# Patient Record
Sex: Male | Born: 1980 | Hispanic: Yes | Marital: Single | Smoking: Current some day smoker
Health system: Southern US, Community
[De-identification: ages and names within clinical notes are randomized; demographics above are authoritative.]

---

## 2014-08-01 ENCOUNTER — Emergency Department (HOSPITAL_COMMUNITY)
Admission: EM | Admit: 2014-08-01 | Discharge: 2014-08-01 | Disposition: A | Discharge status: Home / Self Care | Payer: Self-pay | Attending: Emergency Medicine | Admitting: Emergency Medicine

## 2014-08-01 ENCOUNTER — Emergency Department (HOSPITAL_COMMUNITY): Payer: Self-pay

## 2014-08-01 ENCOUNTER — Encounter (HOSPITAL_COMMUNITY): Payer: Self-pay | Admitting: Emergency Medicine

## 2014-08-01 DIAGNOSIS — R1013 Epigastric pain: Secondary | ICD-10-CM

## 2014-08-01 DIAGNOSIS — Z720 Tobacco use: Secondary | ICD-10-CM | POA: Insufficient documentation

## 2014-08-01 DIAGNOSIS — K2970 Gastritis, unspecified, without bleeding: Secondary | ICD-10-CM | POA: Insufficient documentation

## 2014-08-01 DIAGNOSIS — K760 Fatty (change of) liver, not elsewhere classified: Secondary | ICD-10-CM | POA: Insufficient documentation

## 2014-08-01 LAB — CBC WITH DIFFERENTIAL/PLATELET
Basophils Absolute: 0 10*3/uL (ref 0.0–0.1)
Basophils Relative: 0 % (ref 0–1)
Eosinophils Absolute: 0.1 10*3/uL (ref 0.0–0.7)
Eosinophils Relative: 1 % (ref 0–5)
HCT: 45.3 % (ref 39.0–52.0)
Hemoglobin: 16.6 g/dL (ref 13.0–17.0)
Lymphocytes Relative: 22 % (ref 12–46)
Lymphs Abs: 1.9 10*3/uL (ref 0.7–4.0)
MCH: 30.6 pg (ref 26.0–34.0)
MCHC: 36.6 g/dL — ABNORMAL HIGH (ref 30.0–36.0)
MCV: 83.4 fL (ref 78.0–100.0)
Monocytes Absolute: 0.7 10*3/uL (ref 0.1–1.0)
Monocytes Relative: 8 % (ref 3–12)
Neutro Abs: 6 10*3/uL (ref 1.7–7.7)
Neutrophils Relative %: 69 % (ref 43–77)
Platelets: 198 10*3/uL (ref 150–400)
RBC: 5.43 MIL/uL (ref 4.22–5.81)
RDW: 12.4 % (ref 11.5–15.5)
WBC: 8.7 10*3/uL (ref 4.0–10.5)

## 2014-08-01 LAB — COMPREHENSIVE METABOLIC PANEL
ALT: 62 U/L — ABNORMAL HIGH (ref 0–53)
AST: 44 U/L — ABNORMAL HIGH (ref 0–37)
Albumin: 4.7 g/dL (ref 3.5–5.2)
Alkaline Phosphatase: 94 U/L (ref 39–117)
Anion gap: 10 (ref 5–15)
BUN: 13 mg/dL (ref 6–23)
CO2: 25 mmol/L (ref 19–32)
Calcium: 9.3 mg/dL (ref 8.4–10.5)
Chloride: 100 mEq/L (ref 96–112)
Creatinine, Ser: 0.88 mg/dL (ref 0.50–1.35)
GFR calc Af Amer: >90 mL/min (ref >90)
GFR calc non Af Amer: >90 mL/min (ref >90)
Glucose, Bld: 128 mg/dL — ABNORMAL HIGH (ref 70–99)
Potassium: 3.7 mmol/L (ref 3.5–5.1)
Sodium: 135 mmol/L (ref 135–145)
Total Bilirubin: 0.9 mg/dL (ref 0.3–1.2)
Total Protein: 7.6 g/dL (ref 6.0–8.3)

## 2014-08-01 LAB — LIPASE, BLOOD: Lipase: 37 U/L (ref 11–59)

## 2014-08-01 MED ORDER — ONDANSETRON 4 MG PO TBDP: 4.0000 mg | ORAL_TABLET | Freq: Three times a day (TID) | ORAL | Status: AC | PRN

## 2014-08-01 MED ORDER — FENTANYL CITRATE 0.05 MG/ML IJ SOLN
50.0000 ug | Freq: Once | INTRAMUSCULAR | Status: AC
  Administered 2014-08-01: 50 ug via INTRAVENOUS
  Filled 2014-08-01: qty 2

## 2014-08-01 MED ORDER — ONDANSETRON HCL 4 MG/2ML IJ SOLN
4.0000 mg | Freq: Once | INTRAMUSCULAR | Status: AC
  Administered 2014-08-01: 4 mg via INTRAVENOUS
  Filled 2014-08-01: qty 2

## 2014-08-01 MED ORDER — MORPHINE SULFATE 4 MG/ML IJ SOLN
4.0000 mg | Freq: Once | INTRAMUSCULAR | Status: AC
  Administered 2014-08-01: 4 mg via INTRAVENOUS
  Filled 2014-08-01: qty 1

## 2014-08-01 MED ORDER — PANTOPRAZOLE SODIUM 40 MG PO TBEC: 40.0000 mg | DELAYED_RELEASE_TABLET | Freq: Every day | ORAL | Status: AC

## 2014-08-01 MED ORDER — ONDANSETRON 4 MG PO TBDP
8.0000 mg | ORAL_TABLET | Freq: Once | ORAL | Status: AC
  Administered 2014-08-01: 8 mg via ORAL
  Filled 2014-08-01: qty 2

## 2014-08-01 NOTE — Discharge Instructions (Signed)
Dolor abdominal °(Abdominal Pain) °El dolor puede tener muchas causas. Normalmente la causa del dolor abdominal no es una enfermedad y mejorará sin tratamiento. Frecuentemente puede controlarse y tratarse en casa. Su médico le realizará un examen físico y posiblemente solicite análisis de sangre y radiografías para ayudar a determinar la gravedad de su dolor. Sin embargo, en muchos casos, debe transcurrir más tiempo antes de que se pueda encontrar una causa evidente del dolor. Antes de llegar a ese punto, es posible que su médico no sepa si necesita más pruebas o un tratamiento más profundo. °INSTRUCCIONES PARA EL CUIDADO EN EL HOGAR  °Esté atento al dolor para ver si hay cambios. Las siguientes indicaciones ayudarán a aliviar cualquier molestia que pueda sentir: °· Tome solo medicamentos de venta libre o recetados, según las indicaciones del médico. °· No tome laxantes a menos que se lo haya indicado su médico. °· Pruebe con una dieta líquida absoluta (caldo, té o agua) según se lo indique su médico. Introduzca gradualmente una dieta normal, según su tolerancia. °SOLICITE ATENCIÓN MÉDICA SI: °· Tiene dolor abdominal sin explicación. °· Tiene dolor abdominal relacionado con náuseas o diarrea. °· Tiene dolor cuando orina o defeca. °· Experimenta dolor abdominal que lo despierta de noche. °· Tiene dolor abdominal que empeora o mejora cuando come alimentos. °· Tiene dolor abdominal que empeora cuando come alimentos grasosos. °· Tiene fiebre. °SOLICITE ATENCIÓN MÉDICA DE INMEDIATO SI:  °· El dolor no desaparece en un plazo máximo de 2 horas. °· No deja de (vomitar). °· El dolor se siente solo en partes del abdomen, como el lado derecho o la parte inferior izquierda del abdomen. °· Evacúa materia fecal sanguinolenta o negra, de aspecto alquitranado. °ASEGÚRESE DE QUE: °· Comprende estas instrucciones. °· Controlará su afección. °· Recibirá ayuda de inmediato si no mejora o si empeora. °Document Released: 07/10/2005  Document Revised: 07/15/2013 °[employer]® Patient Information ©2015 [employer]. This information is not intended to replace advice given to you by your health care provider. Make sure you discuss any questions you have with your health care provider. ° °

## 2014-08-01 NOTE — ED Notes (Signed)
Pt. Refused wheelchair and left with all belongings 

## 2014-08-01 NOTE — ED Provider Notes (Signed)
Pt seen and examined.  D/W Dr. Dalene SeltzerSchlossman.  Reports Pain since yesterday, with vomiting today/tonight.  TTP in RUQ. No mid or RLQ pain. No peritoneal irritation. Not jaundiced/icteric. Afebrile.  Minimal elevation of transaminases.  Normal WBC/AlkPhos.  US with fatty infiltration.   Plan is pain and symptom control  DC with PPI, Pain meds, anti-emetics.  Rolland PorterMark Vandora Jaskulski, MD 08/01/14 2325

## 2014-08-01 NOTE — ED Notes (Signed)
Pt from home for eval of mid upper abd pain that started yesterday, pt states he ate chicken soup today and the pain got worse. Pt also reports emesis x3 times today, denies any diarrhea or fevers at home. Denies any cp or sob at this time. Pt states more comfortable with pain following IV meds given in triage.   Interpreter line used.

## 2014-08-01 NOTE — ED Provider Notes (Signed)
CSN: [id number]     Arrival date & time 08/01/14  2023 History   First MD Initiated Contact with Patient 08/01/14 2149     Chief Complaint  Patient presents with  . Emesis  . Abdominal Pain     (Consider location/radiation/quality/duration/timing/severity/associated sxs/prior Treatment) Patient is a 34 y.o. male presenting with abdominal pain.  Abdominal Pain Pain location:  Epigastric Pain quality: sharp   Pain radiates to:  RUQ Pain severity:  Severe Duration:  1 day (started some yesterday, worsened today after eating soup) Timing:  Constant Progression:  Worsening Chronicity:  New Context: eating   Relieved by: medicine/fentanyl in triage. Worsened by:  Eating Ineffective treatments:  None tried Associated symptoms: nausea and vomiting (twice starting 2 hours ago)   Associated symptoms: no chest pain, no constipation, no diarrhea, no fever (felt warm, no measured fever), no melena, no shortness of breath and no sore throat   Risk factors: no alcohol abuse (occasional) and no NSAID use     History reviewed. No pertinent past medical history. History reviewed. No pertinent past surgical history. History reviewed. No pertinent family history. History  Substance Use Topics  . Smoking status: Current Some Day Smoker  . Smokeless tobacco: Not on file  . Alcohol Use: No    Review of Systems  Constitutional: Negative for fever (felt warm, no measured fever).  HENT: Negative for sore throat.   Eyes: Negative for visual disturbance.  Respiratory: Negative for shortness of breath.   Cardiovascular: Negative for chest pain.  Gastrointestinal: Positive for nausea, vomiting (twice starting 2 hours ago) and abdominal pain. Negative for diarrhea, constipation, blood in stool and melena.  Genitourinary: Negative for difficulty urinating.  Musculoskeletal: Negative for back pain and neck stiffness.  Skin: Negative for rash.  Neurological: Negative for syncope and headaches.       Allergies  Review of patient's allergies indicates no known allergies.  Home Medications   Prior to Admission medications   Not on File   BP 127/75 mmHg  Pulse 76  Temp(Src) 98.2 F (36.8 C) (Oral)  Resp 23  Ht 6' (1.829 m)  Wt 190 lb (86.183 kg)  BMI 25.76 kg/m2  SpO2 94% Physical Exam  Constitutional: He is oriented to person, place, and time. He appears well-developed and well-nourished. No distress.  HENT:  Head: Normocephalic and atraumatic.  Eyes: Conjunctivae and EOM are normal.  Neck: Normal range of motion.  Cardiovascular: Normal rate, regular rhythm, normal heart sounds and intact distal pulses.  Exam reveals no gallop and no friction rub.   No murmur heard. Pulmonary/Chest: Effort normal and breath sounds normal. No respiratory distress. He has no wheezes. He has no rales.  Abdominal: Soft. He exhibits no distension. There is tenderness (epigastrum, right upper quadrant). There is no guarding.  Musculoskeletal: He exhibits no edema.  Neurological: He is alert and oriented to person, place, and time.  Skin: Skin is warm and dry. He is not diaphoretic.  Nursing note and vitals reviewed.   ED Course  Procedures (including critical care time) Labs Review Labs Reviewed  COMPREHENSIVE METABOLIC PANEL - Abnormal; Notable for the following:    Glucose, Bld 128 (*)    AST 44 (*)    ALT 62 (*)    All other components within normal limits  CBC WITH DIFFERENTIAL - Abnormal; Notable for the following:    MCHC 36.6 (*)    All other components within normal limits  LIPASE, BLOOD    Imaging Review US  Abdomen Limited Ruq  08/01/2014   CLINICAL DATA:  Epigastric abdominal pain.  Nausea and vomiting.  EXAM: US ABDOMEN LIMITED - RIGHT UPPER QUADRANT  COMPARISON:  None.  FINDINGS: Gallbladder:  No gallstones or wall thickening visualized. No sonographic Murphy sign noted.  Common bile duct:  Diameter: 4.7 mm, normal  Liver:  Mild diffusely increased parenchymal  echotexture suggesting mild diffuse fatty infiltration. No focal lesions identified.  IMPRESSION: Mild fatty infiltration of the liver. Gallbladder and bile ducts are normal.   Electronically Signed   By: Burman NievesWilliam  Stevens M.D.   On: 08/01/2014 23:17     EKG Interpretation None      MDM   Final diagnoses:  Epigastric abdominal pain   34 year old Spanish male with no significant medical history presents with concern of epigastric abdominal pain, nausea and vomiting.  Differential diagnosis includes cholecystitis, pancreatitis, gastritis, hepatitis, peptic ulcer disease. Patient's CMP was significant for mild elevation in his transaminases a 44 and 62. A right upper quadrant ultrasound was obtained which showed no evidence of cholecystitis or cholelithiasis and showed mild fatty infiltration of the liver. Lipase was within normal limits.  He is without any right lower quadrant tenderness and have low suspicion for appendicitis, his exam is overall benign and have low suspicion for other perforated peptic ulcer.  Likely gastritis.  Patient given prescription for Protonix and Zofran and provided number to follow up with community health and wellness.  Discussed all discharge instructions and results with patient in detail using the interpreter service. He was discharged in stable condition with understanding of reasons to return.    Rhae LernerErin Elizabeth Joseangel Nettleton, MD 08/02/14 1358  Rolland PorterMark James, MD 08/11/14 [phone]

## 2014-08-01 NOTE — ED Notes (Signed)
Patient here with complaint of Abdominal pain and nausea. Patient actively vomiting in triage. States that the abdominal pain began yesterday and the emesis began tonight upon arrival to hospital. Explains that he at something yesterday and since then has not felt well. Describes pain as very intense upper medial abdominal pain. Obviously uncomfortable in triage. This information was obtained via translation service over phone.

## 2016-01-15 IMAGING — US US ABDOMEN LIMITED
1 series · 14 of 25 positions shown · non-contrast
Comparison: None.

CLINICAL DATA: Epigastric abdominal pain.  Nausea and vomiting.

EXAM:
US ABDOMEN LIMITED - RIGHT UPPER QUADRANT

[Series 1: us abdomen limited · 0.27mm/px · 14 of 46 slices shown]
[im 1/46]
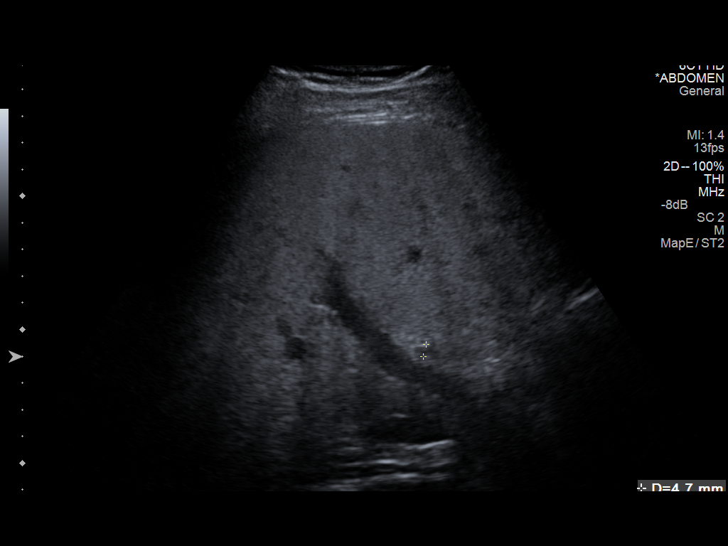
[im 4/46]
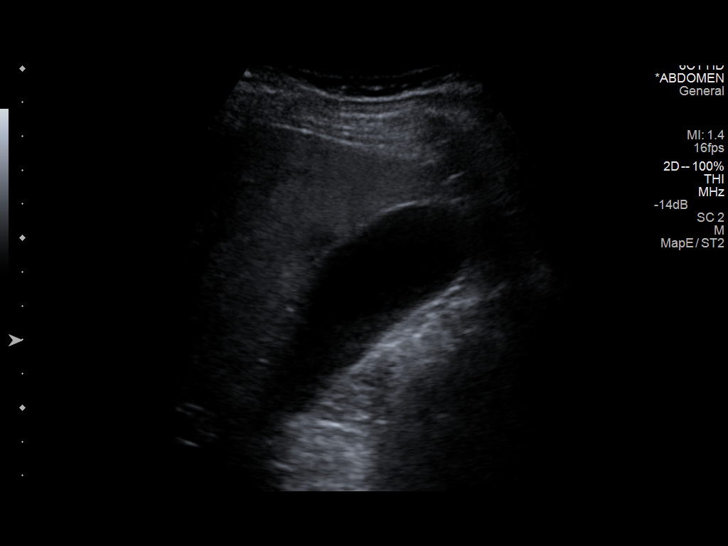
[im 8/46]
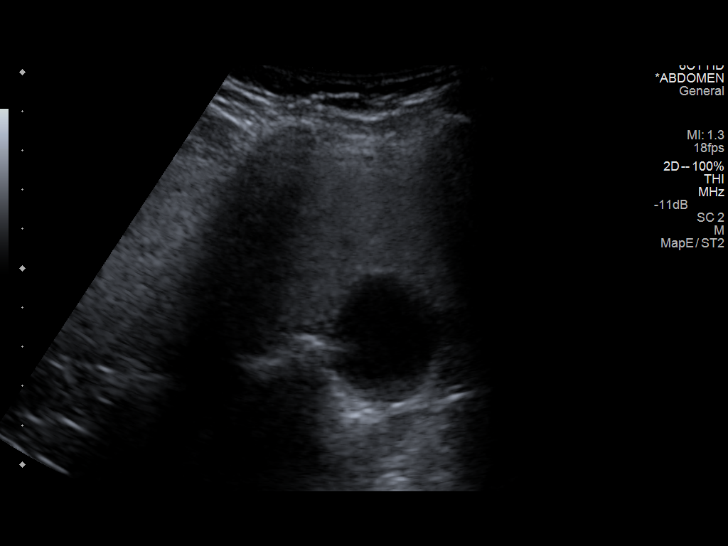
[im 12/46]
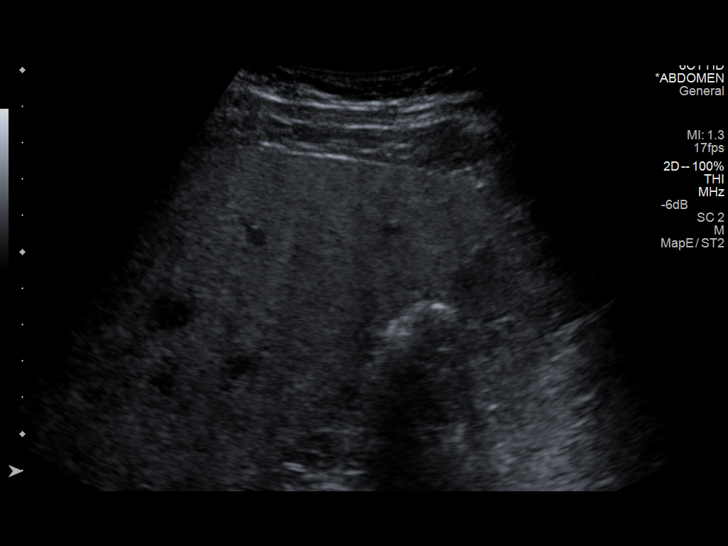
[im 16/46]
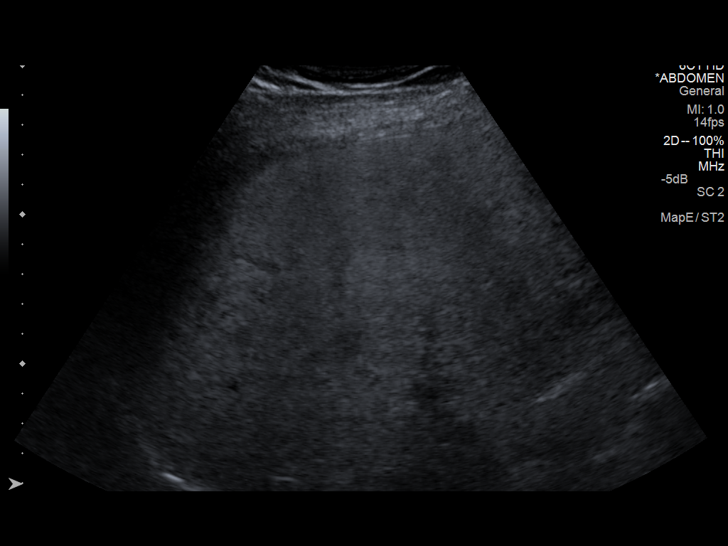
[im 17/46]
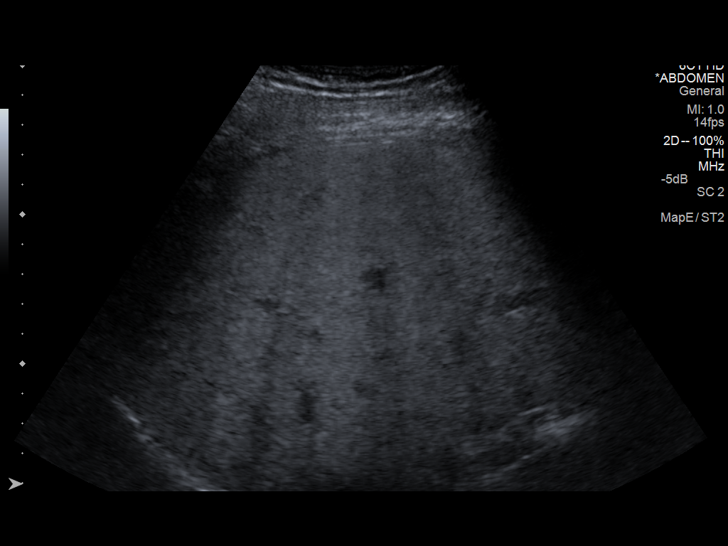
[im 21/46]
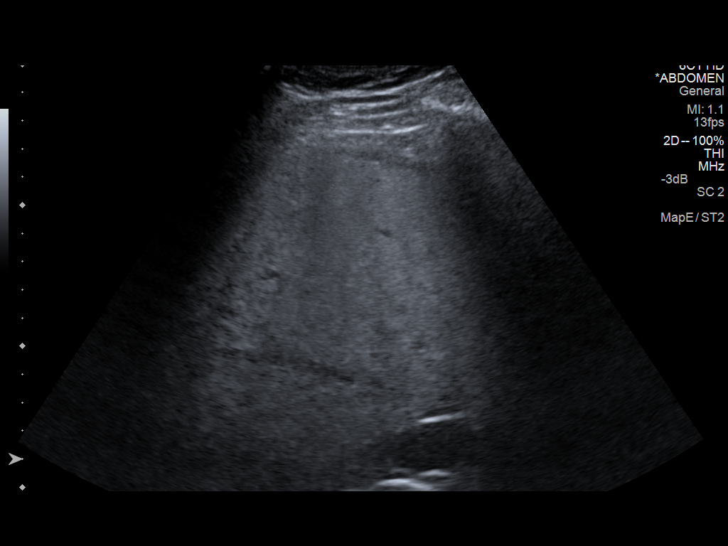
[im 25/46]
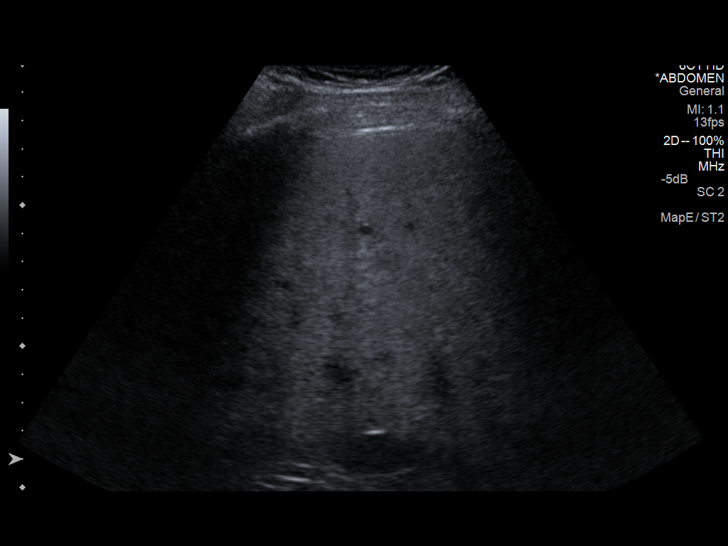
[im 29/46]
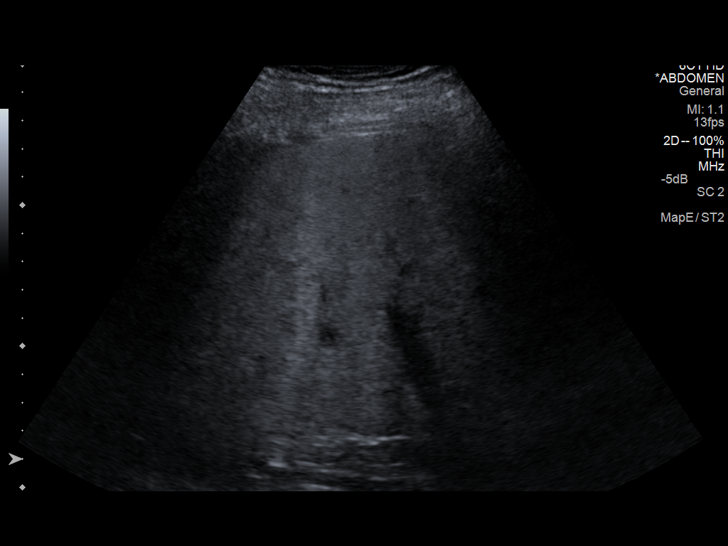
[im 31/46]
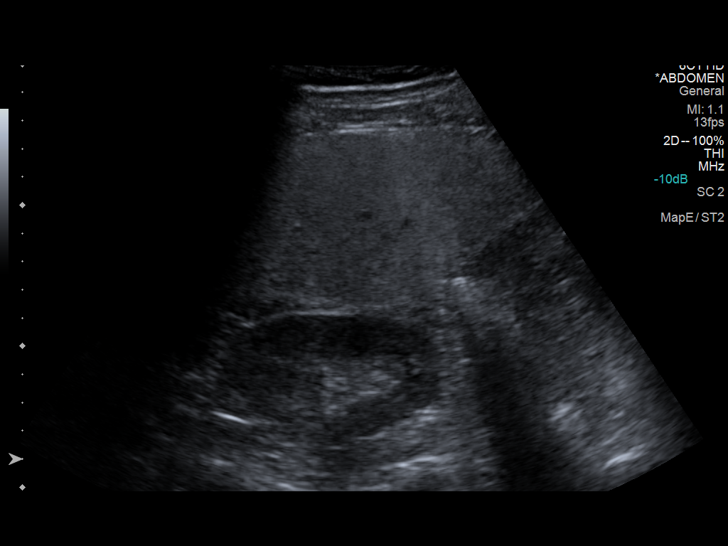
[im 34/46]
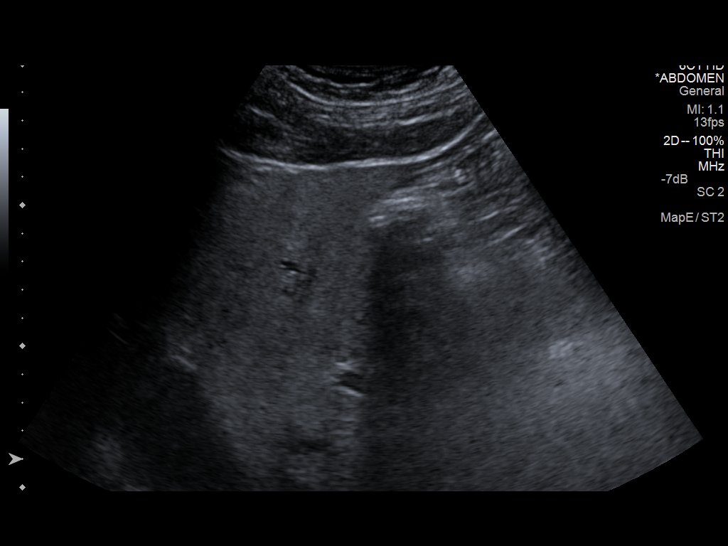
[im 38/46]
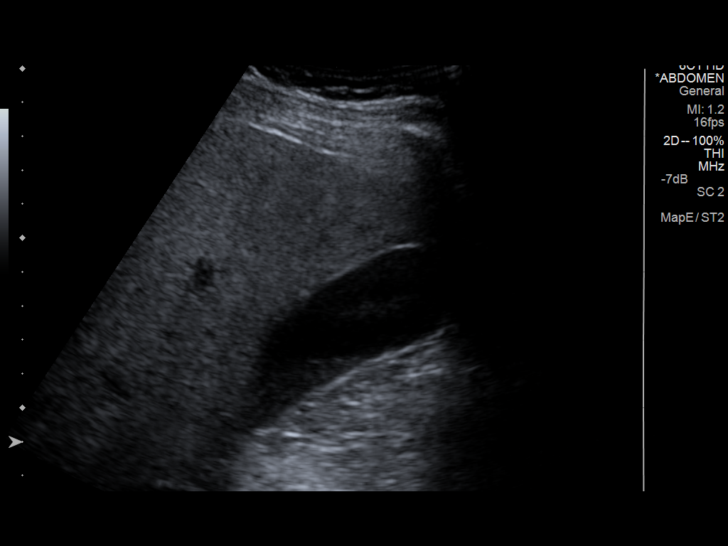
[im 42/46]
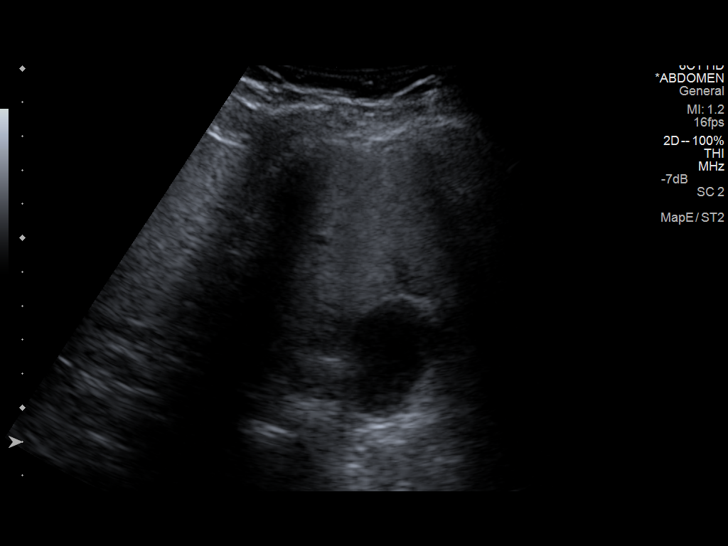
[im 46/46]
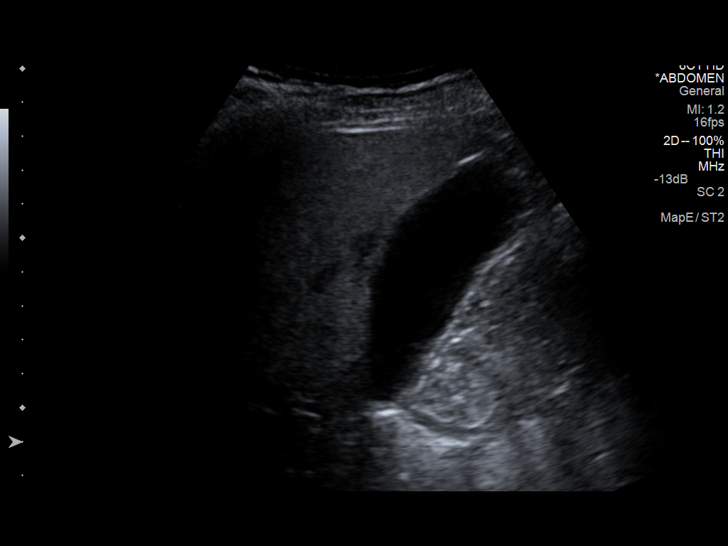

[14 of 25 positions shown; findings below may reference images not displayed]

FINDINGS: Gallbladder:

No gallstones or wall thickening visualized. No sonographic Murphy
sign noted.

Common bile duct:

Diameter: 4.7 mm, normal

Liver:

Mild diffusely increased parenchymal echotexture suggesting mild
diffuse fatty infiltration. No focal lesions identified.
IMPRESSION: Mild fatty infiltration of the liver. Gallbladder and bile ducts are
normal.
